# Patient Record
Sex: Male | Born: 1975 | Race: White | Hispanic: No | Marital: Single | Smoking: Current some day smoker
Health system: Southern US, Community
[De-identification: ages and names within clinical notes are randomized; demographics above are authoritative.]

## PROBLEM LIST (undated history)

## (undated) HISTORY — PX: ABSCESS DRAINAGE: SHX1119

---

## 2007-01-09 ENCOUNTER — Inpatient Hospital Stay: Payer: Self-pay | Admitting: Unknown Physician Specialty

## 2007-02-13 ENCOUNTER — Emergency Department: Payer: Self-pay | Admitting: Internal Medicine

## 2007-09-24 ENCOUNTER — Emergency Department: Payer: Self-pay | Admitting: Emergency Medicine

## 2009-12-04 ENCOUNTER — Emergency Department: Payer: Self-pay | Admitting: Emergency Medicine

## 2013-09-21 ENCOUNTER — Emergency Department: Payer: Self-pay | Admitting: Emergency Medicine

## 2013-09-21 LAB — URINALYSIS, COMPLETE
Bacteria: NONE SEEN
Bilirubin,UR: NEGATIVE
Blood: NEGATIVE
Glucose,UR: NEGATIVE mg/dL (ref 0–75)
Leukocyte Esterase: NEGATIVE
Nitrite: NEGATIVE
Ph: 6 (ref 4.5–8.0)
Protein: NEGATIVE
RBC,UR: <1 /HPF (ref 0–5)
Specific Gravity: 1.02 (ref 1.003–1.030)
Squamous Epithelial: <1
WBC UR: <1 /HPF (ref 0–5)

## 2013-09-21 LAB — COMPREHENSIVE METABOLIC PANEL
Anion Gap: 5 — ABNORMAL LOW (ref 7–16)
Bilirubin,Total: 0.8 mg/dL (ref 0.2–1.0)
Calcium, Total: 9.8 mg/dL (ref 8.5–10.1)
Chloride: 105 mmol/L (ref 98–107)
Osmolality: 277 (ref 275–301)
Potassium: 3.9 mmol/L (ref 3.5–5.1)
SGOT(AST): 28 U/L (ref 15–37)
SGPT (ALT): 35 U/L (ref 12–78)
Sodium: 138 mmol/L (ref 136–145)

## 2013-09-21 LAB — DRUG SCREEN, URINE
Amphetamines, Ur Screen: NEGATIVE (ref ?–1000)
Barbiturates, Ur Screen: NEGATIVE (ref ?–200)
Cannabinoid 50 Ng, Ur ~~LOC~~: NEGATIVE (ref ?–50)
Cocaine Metabolite,Ur ~~LOC~~: POSITIVE (ref ?–300)
MDMA (Ecstasy)Ur Screen: NEGATIVE (ref ?–500)
Methadone, Ur Screen: NEGATIVE (ref ?–300)
Phencyclidine (PCP) Ur S: NEGATIVE (ref ?–25)
Tricyclic, Ur Screen: NEGATIVE (ref ?–1000)

## 2013-09-21 LAB — CBC
HCT: 49.9 % (ref 40.0–52.0)
HGB: 17.6 g/dL (ref 13.0–18.0)
Platelet: 183 10*3/uL (ref 150–440)
RDW: 13.5 % (ref 11.5–14.5)
WBC: 8.7 10*3/uL (ref 3.8–10.6)

## 2013-09-21 LAB — ETHANOL: Ethanol: <3 mg/dL

## 2013-09-21 LAB — TSH: Thyroid Stimulating Horm: 1.77 u[IU]/mL

## 2014-12-04 ENCOUNTER — Emergency Department: Payer: Self-pay | Admitting: Student

## 2014-12-04 LAB — BASIC METABOLIC PANEL
Anion Gap: 9 (ref 7–16)
BUN: 10 mg/dL (ref 7–18)
CALCIUM: 9.1 mg/dL (ref 8.5–10.1)
CO2: 23 mmol/L (ref 21–32)
CREATININE: 1.16 mg/dL (ref 0.60–1.30)
Chloride: 106 mmol/L (ref 98–107)
EGFR (African American): >60
EGFR (Non-African Amer.): >60
Glucose: 113 mg/dL — ABNORMAL HIGH (ref 65–99)
OSMOLALITY: 276 (ref 275–301)
POTASSIUM: 4.3 mmol/L (ref 3.5–5.1)
SODIUM: 138 mmol/L (ref 136–145)

## 2014-12-04 LAB — CBC WITH DIFFERENTIAL/PLATELET
BASOS ABS: 0 10*3/uL (ref 0.0–0.1)
Basophil %: 0.2 %
Eosinophil #: 0.2 10*3/uL (ref 0.0–0.7)
Eosinophil %: 1.2 %
HCT: 49.3 % (ref 40.0–52.0)
HGB: 16.9 g/dL (ref 13.0–18.0)
Lymphocyte #: 1.8 10*3/uL (ref 1.0–3.6)
Lymphocyte %: 14.8 %
MCH: 34 pg (ref 26.0–34.0)
MCHC: 34.3 g/dL (ref 32.0–36.0)
MCV: 99 fL (ref 80–100)
MONO ABS: 0.7 x10 3/mm (ref 0.2–1.0)
MONOS PCT: 5.6 %
NEUTROS ABS: 9.5 10*3/uL — AB (ref 1.4–6.5)
Neutrophil %: 78.2 %
Platelet: 173 10*3/uL (ref 150–440)
RBC: 4.96 10*6/uL (ref 4.40–5.90)
RDW: 13 % (ref 11.5–14.5)
WBC: 12.2 10*3/uL — ABNORMAL HIGH (ref 3.8–10.6)

## 2015-04-21 NOTE — H&P (Signed)
   Subjective/Chief Complaint Buttock abscess   History of Present Illness 39 yo with no significant past medical history who presents with 2 days of perianal pain.  Began acutely.  No improvement.  Doing well otherwise.  No fevers/chills.  Excruciating.   Code Status Full Code   Past Med/Surgical Hx:  Denies medical history:   Denies surgical history.:   ALLERGIES:  No Known Allergies:   Family and Social History:  Family History Non-Contributory   Social History positive  tobacco, negative ETOH   Place of Living Home   Review of Systems:  Subjective/Chief Complaint Perianal pain   Fever/Chills Yes   Cough No   Sputum No   Abdominal Pain No   Diarrhea No   Constipation No   Nausea/Vomiting No   SOB/DOE No   Chest Pain No   Dysuria No   Tolerating Diet No  Nauseated   Physical Exam:  GEN well developed, well nourished, no acute distress   HEENT pink conjunctivae, PERRL, hearing intact to voice   RESP normal resp effort  clear BS  no use of accessory muscles   CARD regular rate  no murmur  no thrills   ABD denies tenderness  normal BS  + right perianal pain/abscess   LYMPH negative neck, negative axillae   EXTR negative cyanosis/clubbing, negative edema   SKIN normal to palpation, No rashes, No ulcers   NEURO cranial nerves intact, negative rigidity, negative tremor, follows commands, spastic, flaccid, strength:, motor/sensory function intact   PSYCH A+O to time, place, person, good insight    Assessment/Admission Diagnosis 39 yo M with 2 days of perianal pain, obvious abscess on physical exam.   Plan To OR for I and D of perianal abscess.   Electronic Signatures: Jarvis NewcomerLundquist, Locke Barrell A (MD)  (Signed 07-Dec-15 20:18)  Authored: CHIEF COMPLAINT and HISTORY, PAST MEDICAL/SURGIAL HISTORY, ALLERGIES, FAMILY AND SOCIAL HISTORY, REVIEW OF SYSTEMS, PHYSICAL EXAM, ASSESSMENT AND PLAN   Last Updated: 07-Dec-15 20:18 by Jarvis NewcomerLundquist, Jaylenn Baiza A  (MD)

## 2015-04-21 NOTE — Op Note (Signed)
PATIENT NAME:  [name], Shaun Beasley MR#:  [id number] DATE OF BIRTH:  [birth year]  DATE OF PROCEDUCampbell StallRE:  12/04/2014  SURGEON: Cristal Deerhristopher A. Quintus Premo, MD  PREOPERATIVE DIAGNOSIS: Left perianal abscess.   POSTOPERATIVE DIAGNOSIS: Left perianal abscess.   PROCEDURES PERFORMED: Incision and drainage of left perianal abscess, rectal examination under anesthesia.   ANESTHESIA: General.   ESTIMATED BLOOD LOSS: 10 mL.   COMPLICATIONS: None.   SPECIMENS: None.   INDICATION FOR SURGERY: Mr. Shaun Beasley is a pleasant 39 year old male who presents with increasing perianal pain. He had an area of induration and erythema on examination. He, thus, was brought to the operating room for I and D of probable perianal abscess and rectal examination under anesthesia.   DETAILS OF PROCEDURE: Informed consent was obtained. Mr. Shaun Beasley was brought to the operating room suite. He was induced. Endotracheal tube was placed, general anesthesia was administered. He was put up in stirrups. His anus was prepped and draped in standard surgical fashion. A timeout was then performed correctly identifying the patient name, operative site, and procedure to be performed. Next, there was no obvious fistulous connection. No purulence when the abscess was squeezed. I then used an 11 blade knife to incise the abscess. There was a large amount of purulence. I used a hemostat to open up any loculations. I then made an ellipse of skin to excise to prevent the wound from closing. The wound was irrigated and then packed with half inch iodoform gauze. A sterile dressing was then placed over the wound. The patient was then awoken, extubated, and brought to the postanesthesia care unit. There were no immediate complications. Needle, sponge, and instrument counts correct at the end of the procedure.    ____________________________ Si Raiderhristopher A. Ariana Juul, MD cal:bm D: 12/05/2014 19:18:09 ET T: 12/05/2014 22:58:15 ET JOB#: [id number]  cc: Cristal Deerhristopher  A. Reegan Bouffard, MD, <Dictator> Jarvis NewcomerHRISTOPHER A Huie Ghuman MD ELECTRONICALLY SIGNED 12/26/2014 16:21

## 2015-04-30 IMAGING — CT CT PELVIS W/ CM
2 of 3 series · 16 of 46 positions shown, 18 images · IV contrast (agent unspecified)
Comparison: None.

CLINICAL DATA: Leukocytosis, extreme pain from perianal abscess

EXAM:
CT PELVIS WITH CONTRAST
TECHNIQUE: Multidetector CT imaging of the pelvis was performed using the
standard protocol following the bolus administration of intravenous
contrast.
CONTRAST:  100 mL Qsovue-MDD

[Series 2: routine pel with · axial · 0.72mm/px · z∈[-490,-230]mm · 13 of 60 slices shown, 15 images]
[im 4/60  soft-tissue]
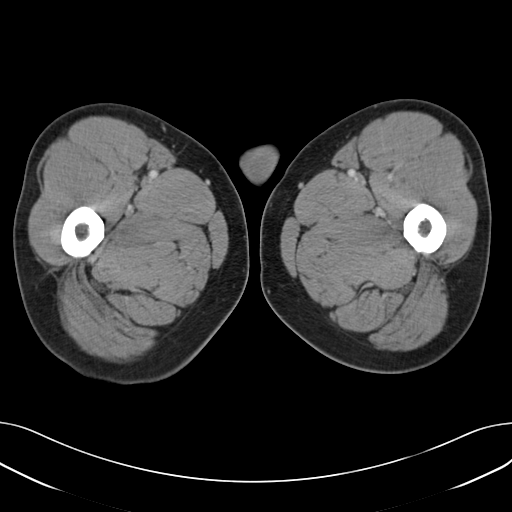
[im 4/60  bone]
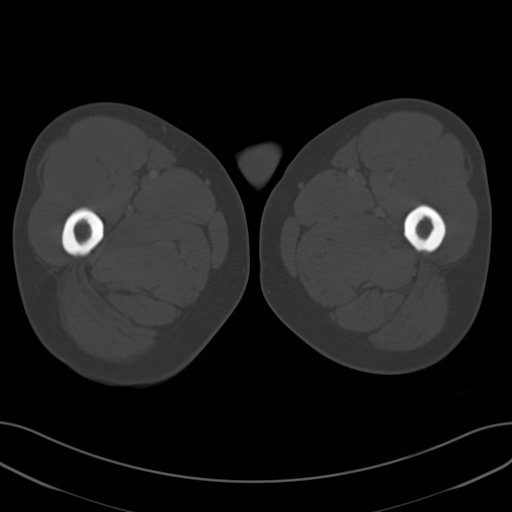
[im 8/60  soft-tissue]
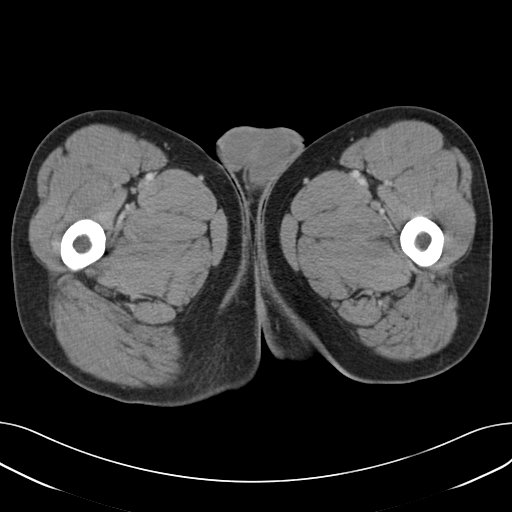
[im 12/60  soft-tissue]
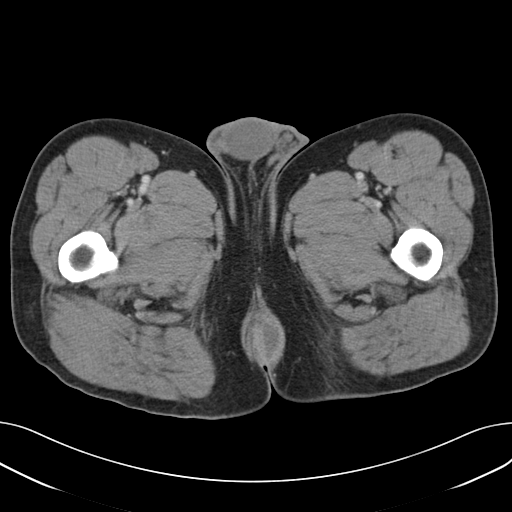
[im 18/60  soft-tissue]
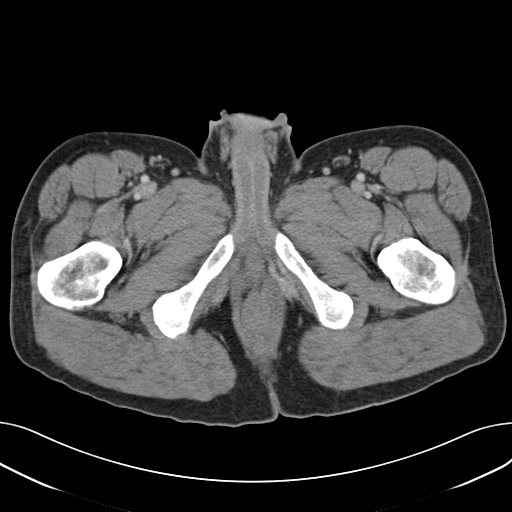
[im 21/60  soft-tissue]
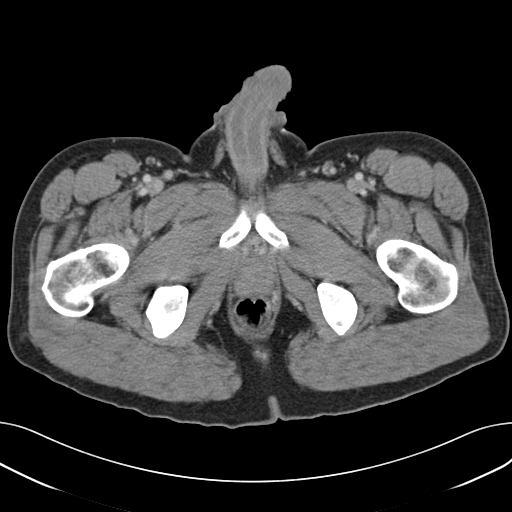
[im 25/60  soft-tissue]
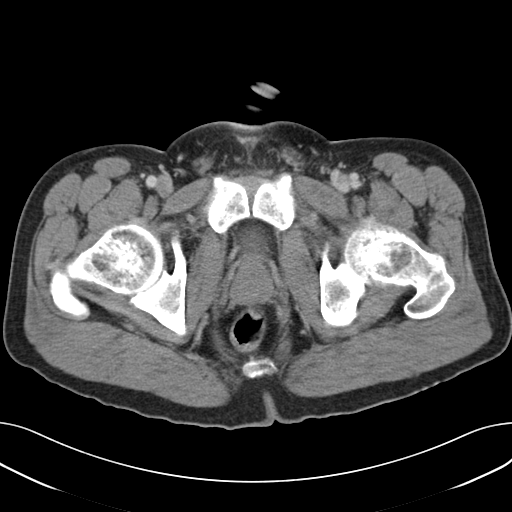
[im 31/60  soft-tissue]
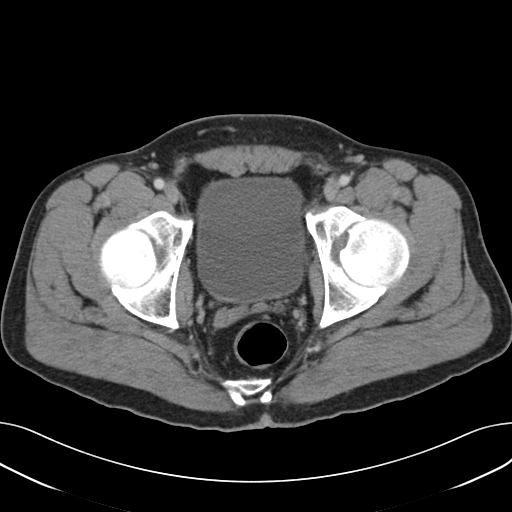
[im 35/60  soft-tissue]
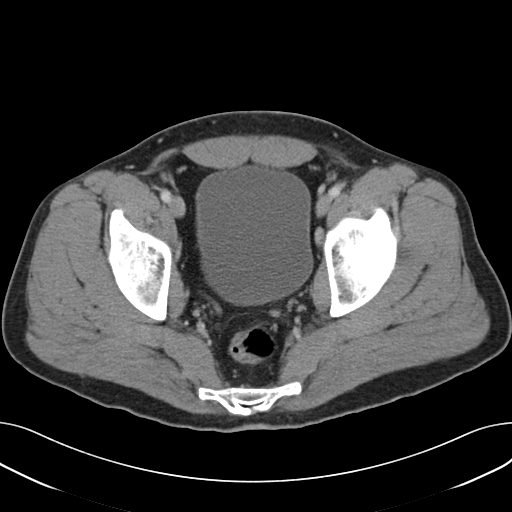
[im 39/60  soft-tissue]
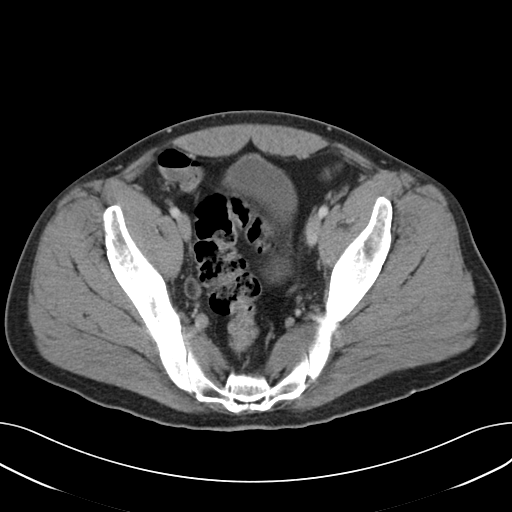
[im 39/60  bone]
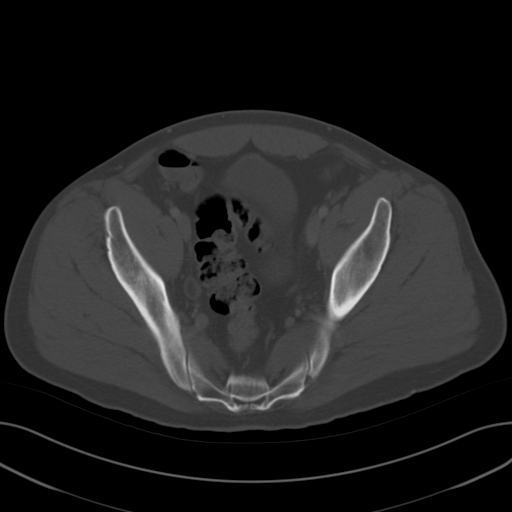
[im 42/60  soft-tissue]
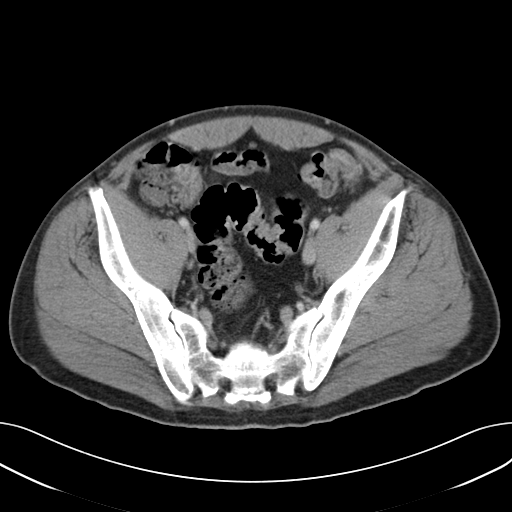
[im 48/60  soft-tissue]
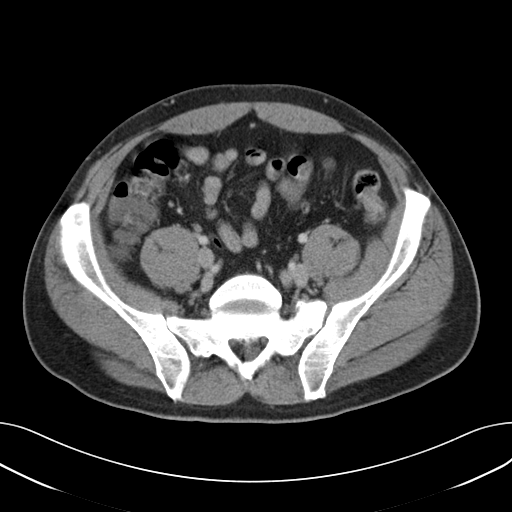
[im 52/60  soft-tissue]
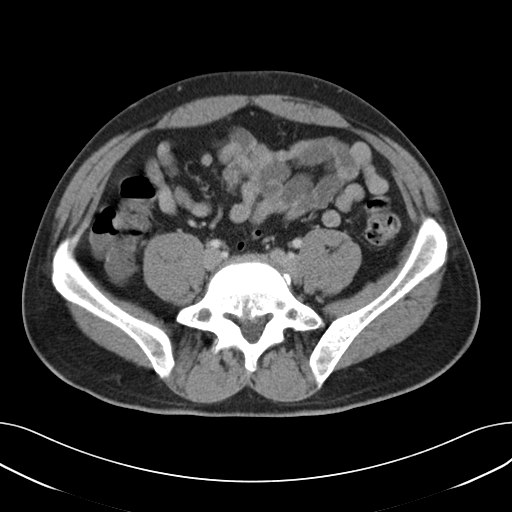
[im 56/60  soft-tissue]
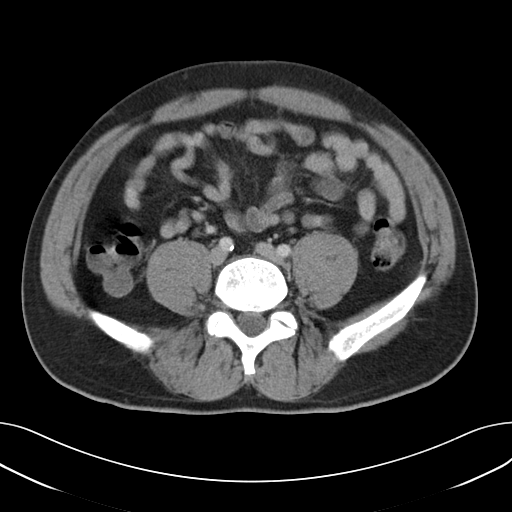

[Series 4: cor routine pel with · coronal · 0.55mm/px · 3 of 119 slices shown]
[im 40/119  soft-tissue]
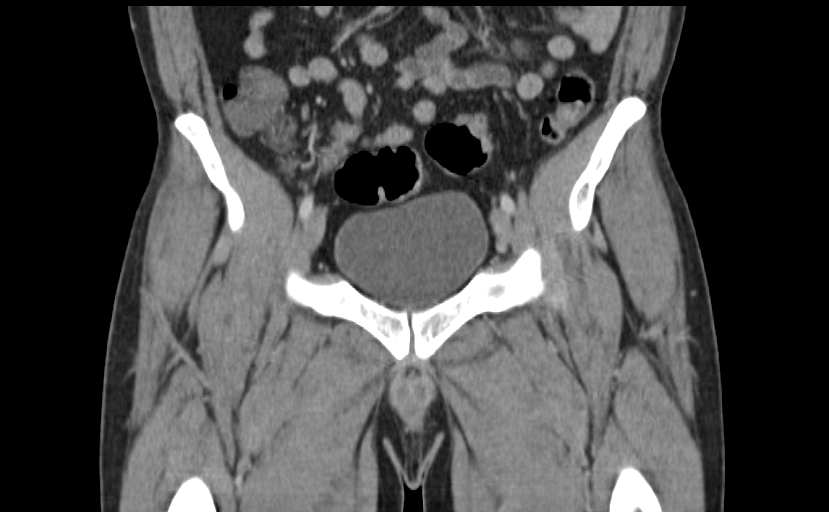
[im 53/119  soft-tissue]
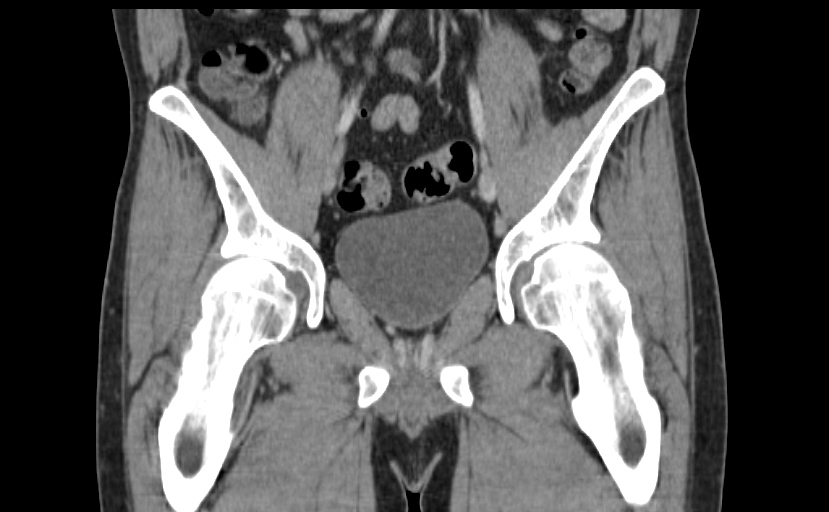
[im 66/119  soft-tissue]
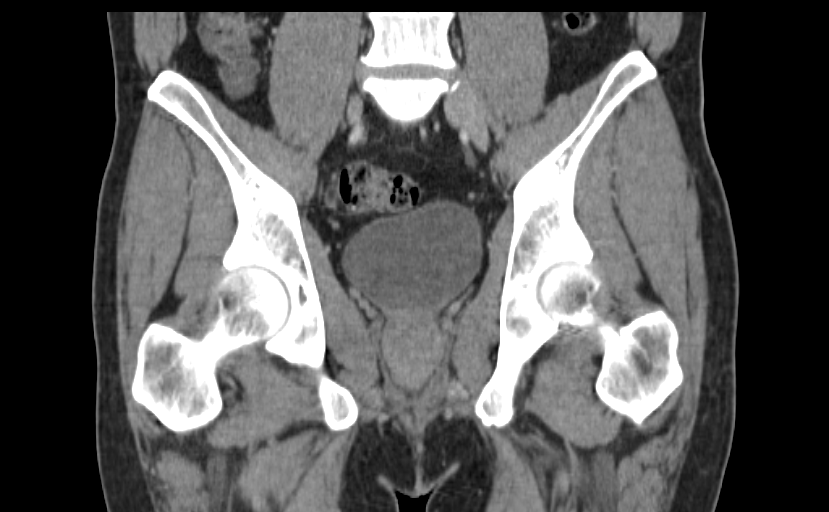

[16 of 46 positions shown; findings below may reference images not displayed]

FINDINGS: The bladder is well distended. Mild diverticular change is seen. The
appendix is within normal limits. No pelvic mass lesion is noted. No
significant lymphadenopathy is noted.

Just to the left of the anus there is a 3.2 x 1.4 cm fluid
attenuation lesion with peripheral enhancement consistent with the
given clinical history of PE renal abscess. No significant
surrounding inflammatory changes noted. No other focal abnormality
is seen. The visualized bony structures are within normal limits.
IMPRESSION: Left perianal abscess as described above. No other focal abnormality
is noted.

## 2016-06-09 ENCOUNTER — Emergency Department: Payer: Self-pay

## 2016-06-09 ENCOUNTER — Emergency Department
Admission: EM | Admit: 2016-06-09 | Discharge: 2016-06-09 | Disposition: A | Discharge status: Home / Self Care | Payer: Self-pay | Attending: Emergency Medicine | Admitting: Emergency Medicine

## 2016-06-09 ENCOUNTER — Encounter: Payer: Self-pay | Admitting: Emergency Medicine

## 2016-06-09 DIAGNOSIS — Y999 Unspecified external cause status: Secondary | ICD-10-CM | POA: Insufficient documentation

## 2016-06-09 DIAGNOSIS — Y9389 Activity, other specified: Secondary | ICD-10-CM | POA: Insufficient documentation

## 2016-06-09 DIAGNOSIS — Y929 Unspecified place or not applicable: Secondary | ICD-10-CM | POA: Insufficient documentation

## 2016-06-09 DIAGNOSIS — W1840XA Slipping, tripping and stumbling without falling, unspecified, initial encounter: Secondary | ICD-10-CM | POA: Insufficient documentation

## 2016-06-09 DIAGNOSIS — F17210 Nicotine dependence, cigarettes, uncomplicated: Secondary | ICD-10-CM | POA: Insufficient documentation

## 2016-06-09 DIAGNOSIS — S2232XA Fracture of one rib, left side, initial encounter for closed fracture: Secondary | ICD-10-CM | POA: Insufficient documentation

## 2016-06-09 MED ORDER — OXYCODONE-ACETAMINOPHEN 5-325 MG PO TABS
1.0000 | ORAL_TABLET | Freq: Once | ORAL | Status: AC
  Administered 2016-06-09: 1 via ORAL
  Filled 2016-06-09: qty 1

## 2016-06-09 MED ORDER — OXYCODONE-ACETAMINOPHEN 7.5-325 MG PO TABS: 1.0000 | ORAL_TABLET | ORAL | Status: AC | PRN

## 2016-06-09 MED ORDER — IBUPROFEN 600 MG PO TABS: 600.0000 mg | ORAL_TABLET | Freq: Four times a day (QID) | ORAL | Status: AC | PRN

## 2016-06-09 MED ORDER — IBUPROFEN 600 MG PO TABS
600.0000 mg | ORAL_TABLET | Freq: Once | ORAL | Status: AC
  Administered 2016-06-09: 600 mg via ORAL
  Filled 2016-06-09: qty 1

## 2016-06-09 NOTE — Discharge Instructions (Signed)
Rib Fracture °A rib fracture is a break or crack in one of the bones of the ribs. The ribs are like a cage that goes around your upper chest. A broken or cracked rib is often painful, but most do not cause other problems. Most rib fractures heal on their own in 1-3 months. °HOME CARE °· Avoid activities that cause pain to the injured area. Protect your injured area. °· Slowly increase activity as told by your doctor. °· Take medicine as told by your doctor. °· Put ice on the injured area for the first 1-2 days after you have been treated or as told by your doctor. °¨ Put ice in a plastic bag. °¨ Place a towel between your skin and the bag. °¨ Leave the ice on for 15-20 minutes at a time, every 2 hours while you are awake. °· Do deep breathing as told by your doctor. You may be told to: °¨ Take deep breaths many times a day. °¨ Cough many times a day while hugging a pillow. °¨ Use a device (incentive spirometer) to perform deep breathing many times a day. °· Drink enough fluids to keep your pee (urine) clear or pale yellow.   °· Do not wear a rib belt or binder. These do not allow you to breathe deeply. °GET HELP RIGHT AWAY IF:  °· You have a fever. °· You have trouble breathing.   °· You cannot stop coughing. °· You cough up thick or bloody spit (mucus).   °· You feel sick to your stomach (nauseous), throw up (vomit), or have belly (abdominal) pain.   °· Your pain gets worse and medicine does not help.   °MAKE SURE YOU:  °· Understand these instructions. °· Will watch your condition. °· Will get help right away if you are not doing well or get worse. °  °This information is not intended to replace advice given to you by your health care provider. Make sure you discuss any questions you have with your health care provider. °  °Document Released: 09/23/2008 Document Revised: 04/11/2013 Document Reviewed: 02/16/2013 °Elsevier Interactive Patient Education ©2016 [employer] ° °

## 2016-06-09 NOTE — ED Provider Notes (Signed)
[facility] Emergency Department Provider Note    ____________________________________________  Time seen: Approximately 1:23 PM  I have reviewed the triage vital signs and the nursing notes.   HISTORY  Chief Complaint Rib Injury    HPI Shaun Beasley is a 40 y.o. male patient complained of left anterior rib pain secondary to a slip and slide incident. Patient's the incident occurred with ago and is no improvement. Patient states pain increase with movement and deep breathing. Patient is taking over-the-counter anti-inflammatories without any relief.Patient rates his pain as a 5/10. Patient described a pain as "intermittently sharp". No other palliative measures taken for this complaint.   History reviewed. No pertinent past medical history.  There are no active problems to display for this patient.   Past Surgical History  Procedure Laterality Date  . Abscess drainage      Current Outpatient Rx  Name  Route  Sig  Dispense  Refill  . ibuprofen (ADVIL,MOTRIN) 600 MG tablet   Oral   Take 1 tablet (600 mg total) by mouth every 6 (six) hours as needed.   30 tablet   0   . oxyCODONE-acetaminophen (PERCOCET) 7.5-325 MG tablet   Oral   Take 1 tablet by mouth every 4 (four) hours as needed for severe pain.   20 tablet   0     Allergies Review of patient's allergies indicates not on file.  History reviewed. No pertinent family history.  Social History Social History  Substance Use Topics  . Smoking status: Current Some Day Smoker -- 0.50 packs/day    Types: Cigarettes  . Smokeless tobacco: None  . Alcohol Use: 15.0 oz/week    25 Cans of beer per week    Review of Systems Constitutional: No fever/chills Eyes: No visual changes. ENT: No sore throat. Cardiovascular: Denies chest pain. Respiratory: Denies shortness of breath. Gastrointestinal: No abdominal pain.  No nausea, no vomiting.  No diarrhea.  No  constipation. Genitourinary: Negative for dysuria. Musculoskeletal:Left rib pain Skin: Negative for rash. Neurological: Negative for headaches, focal weakness or numbness.   ____________________________________________   PHYSICAL EXAM:  VITAL SIGNS: ED Triage Vitals  Enc Vitals Group     BP 06/09/16 1309 125/67 mmHg     Pulse Rate 06/09/16 1309 69     Resp 06/09/16 1309 18     Temp 06/09/16 1309 98.4 F (36.9 C)     Temp Source 06/09/16 1309 Oral     SpO2 06/09/16 1309 97 %     Weight 06/09/16 1309 200 lb (90.719 kg)     Height 06/09/16 1309 6\' 1"  (1.854 m)     Head Cir --      Peak Flow --      Pain Score 06/09/16 1309 5     Pain Loc --      Pain Edu? --      Excl. in GC? --     Constitutional: Alert and oriented. Well appearing and in no acute distress. Eyes: Conjunctivae are normal. PERRL. EOMI. Head: Atraumatic. Nose: No congestion/rhinnorhea. Mouth/Throat: Mucous membranes are moist.  Oropharynx non-erythematous. Neck: No stridor.  No cervical spine tenderness to palpation. Hematological/Lymphatic/Immunilogical: No cervical lymphadenopathy. Cardiovascular: Normal rate, regular rhythm. Grossly normal heart sounds.  Good peripheral circulation. Respiratory: Normal respiratory effort.  No retractions. Lungs CTAB. Gastrointestinal: Soft and nontender. No distention. No abdominal bruits. No CVA tenderness. Musculoskeletal:No obvious chest wall deformities. There is no abrasion or ecchymosis. Patient tender palpation anterior chest wall. Patient  is splinting with deep inspiration.  Neurologic:  Normal speech and language. No gross focal neurologic deficits are appreciated. No gait instability. Skin:  Skin is warm, dry and intact. No rash noted. Psychiatric: Mood and affect are normal. Speech and behavior are normal.  ____________________________________________   LABS (all labs ordered are listed, but only abnormal results are displayed)  Labs Reviewed - No data to  display ____________________________________________  EKG   ____________________________________________  RADIOLOGY  Patient has nondisplaced seventh left rib fracture. ____________________________________________   PROCEDURES  Procedure(s) performed: None  Critical Care performed: No  ____________________________________________   INITIAL IMPRESSION / ASSESSMENT AND PLAN / ED COURSE  Pertinent labs & imaging results that were available during my care of the patient were reviewed by me and considered in my medical decision making (see chart for details).  Left lateral rib fracture. Cuts x-ray finding with patient. Patient given discharge care instructions. Patient given a work note. Patient given a prescription for Percocets and ibuprofen. Patient advised [facility]. ____________________________________________   FINAL CLINICAL IMPRESSION(S) / ED DIAGNOSES  Final diagnoses:  Left rib fracture, closed, initial encounter      NEW MEDICATIONS STARTED DURING THIS VISIT:  New Prescriptions   IBUPROFEN (ADVIL,MOTRIN) 600 MG TABLET    Take 1 tablet (600 mg total) by mouth every 6 (six) hours as needed.   OXYCODONE-ACETAMINOPHEN (PERCOCET) 7.5-325 MG TABLET    Take 1 tablet by mouth every 4 (four) hours as needed for severe pain.     Note:  This document was prepared using Dragon voice recognition software and may include unintentional dictation errors.    Joni Reining, PA-C 06/09/16 1400  Sharman Cheek, MD 06/09/16 [phone]

## 2016-06-09 NOTE — ED Notes (Signed)
Pt from home with pain in ribs caused by injury during slip-n-slide play. Pt states it hasn't gotten better and that he has some sob when he moves in certain ways and when he takes a deep breath. Pt alert & oriented with NAD noted.

## 2016-11-03 IMAGING — CR DG RIBS W/ CHEST 3+V*L*
1 series · 5 of 5 positions shown · non-contrast
Comparison: None.

CLINICAL DATA: Left anterior rib pain since the patient fell on a
slip and slide 1 week ago. Initial encounter.

EXAM:
LEFT RIBS AND CHEST - 3+ VIEW

[Series 1: dg ribs unilateral w/chest left · 0.14mm/px · 5 of 5 slices shown]
[im 1/5]
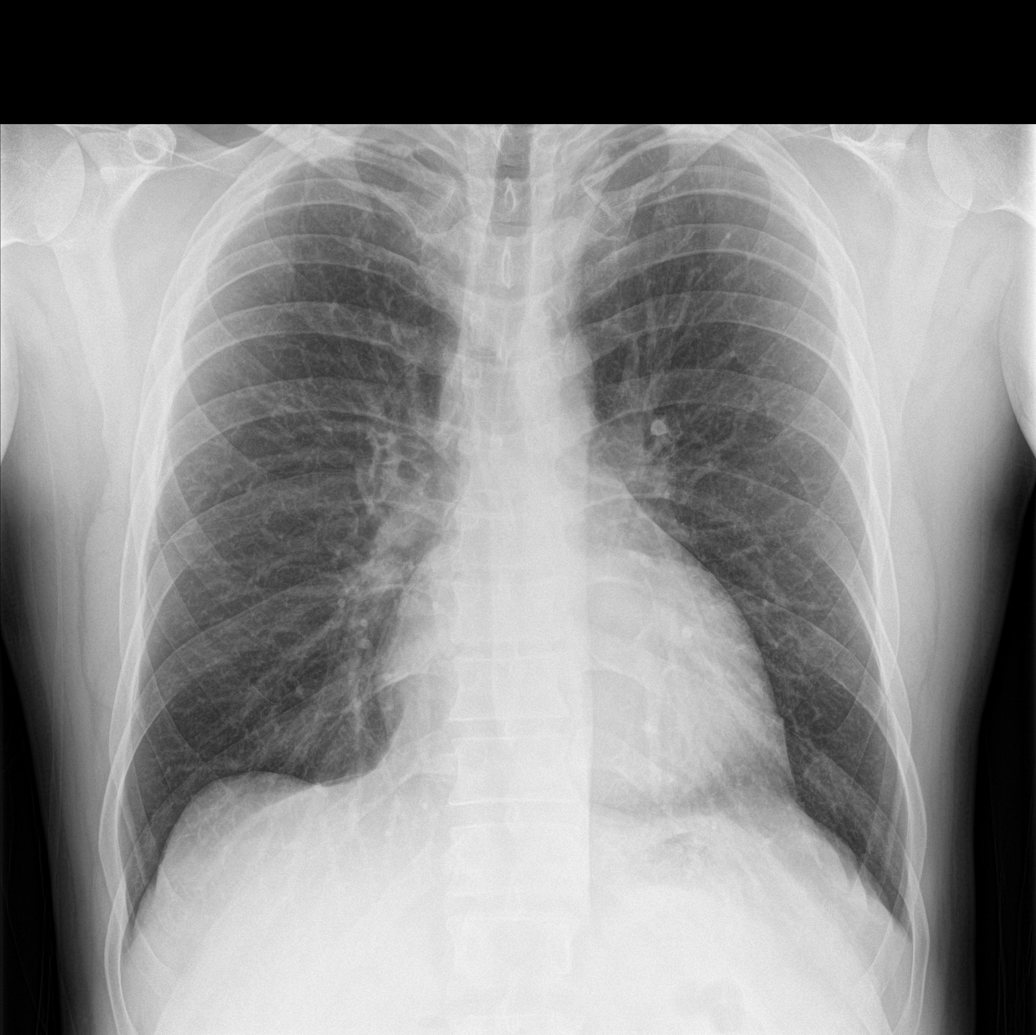
[im 2/5]
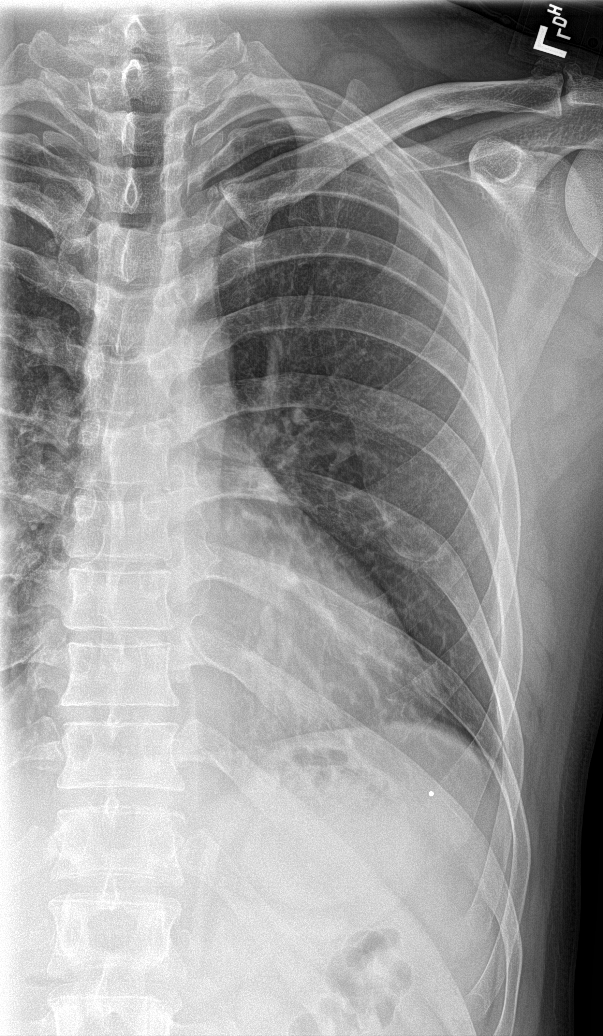
[im 3/5]
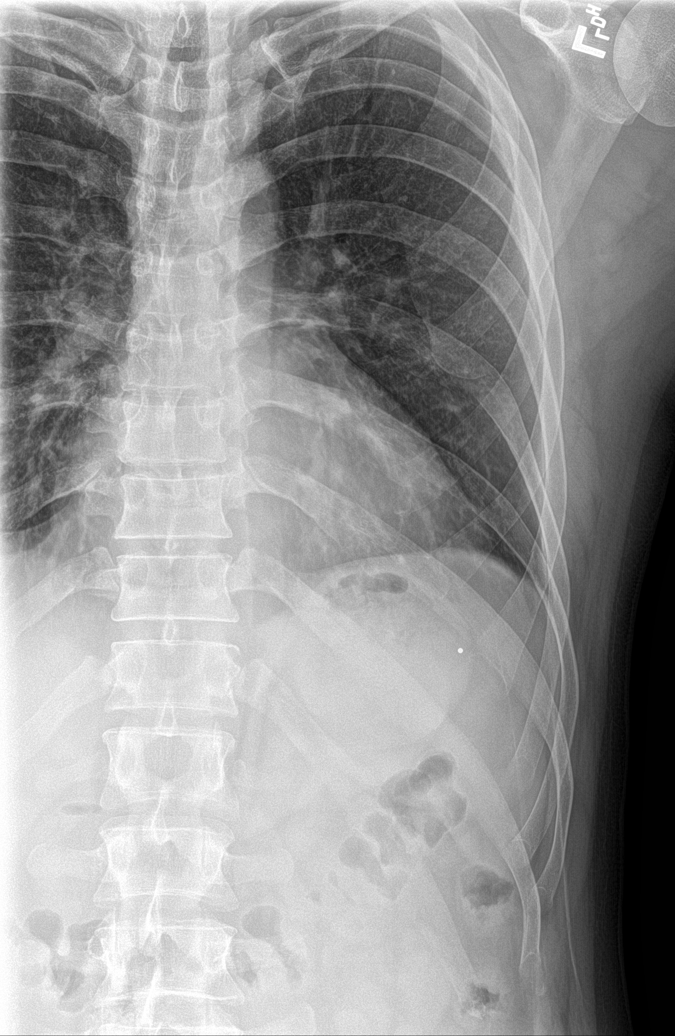
[im 4/5]
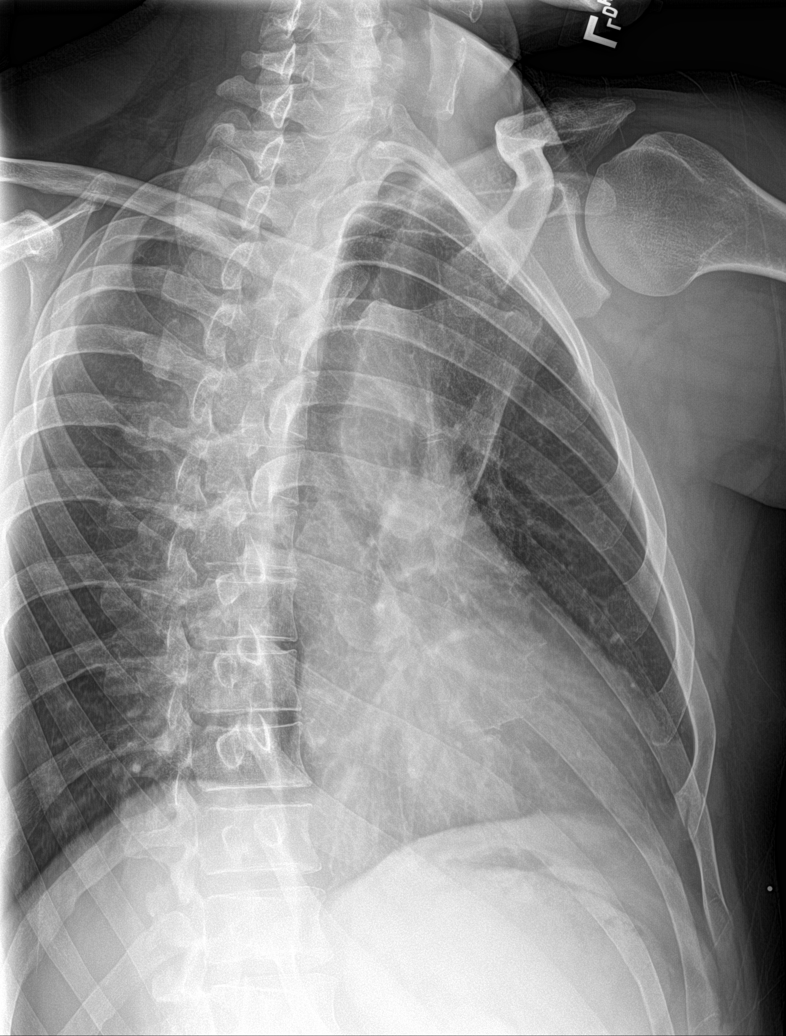
[im 5/5]
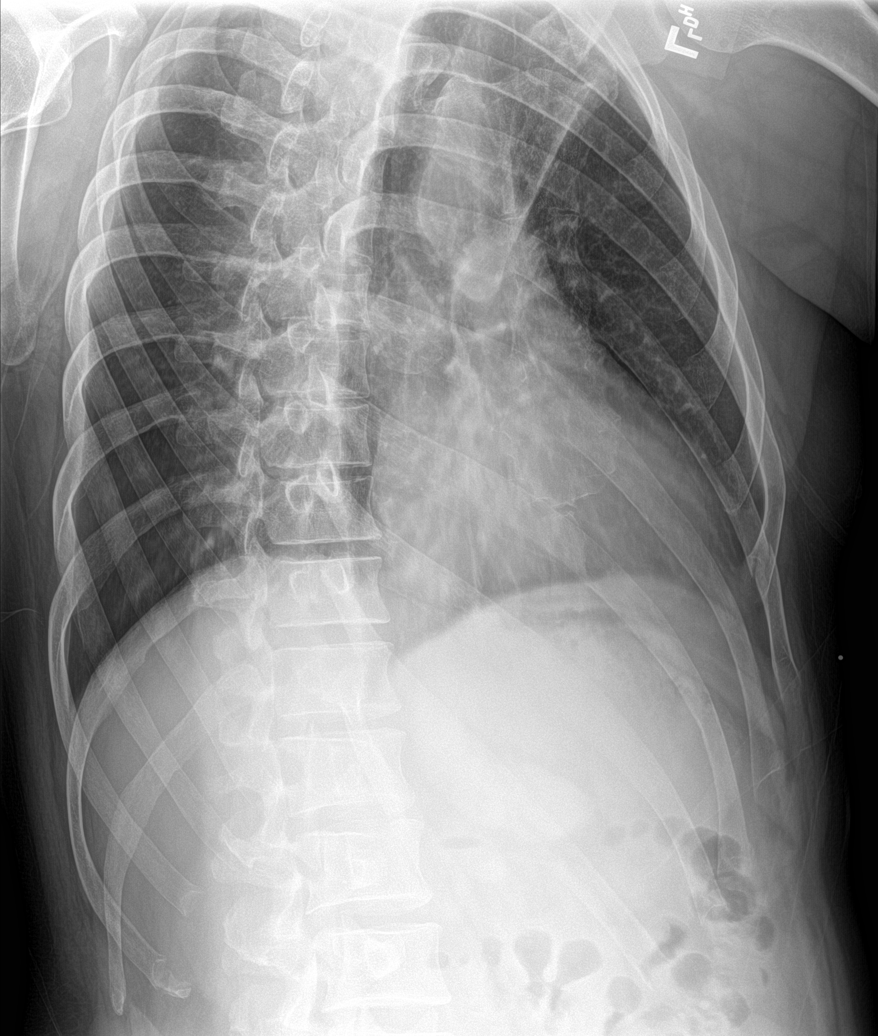

[5 of 5 positions shown; findings below may reference images not displayed]

FINDINGS: The lungs are clear. Heart size is normal. No pneumothorax or
pleural effusion. Minimally displaced fracture of the left eighth
rib is identified.
IMPRESSION: Minimally displaced left eighth rib fracture. The examination is
otherwise negative.

## 2017-05-02 ENCOUNTER — Encounter: Payer: Self-pay | Admitting: Emergency Medicine

## 2017-05-02 ENCOUNTER — Emergency Department
Admission: EM | Admit: 2017-05-02 | Discharge: 2017-05-02 | Disposition: A | Discharge status: Home / Self Care | Payer: Self-pay | Attending: Emergency Medicine | Admitting: Emergency Medicine

## 2017-05-02 DIAGNOSIS — S91312A Laceration without foreign body, left foot, initial encounter: Secondary | ICD-10-CM

## 2017-05-02 DIAGNOSIS — Y9302 Activity, running: Secondary | ICD-10-CM | POA: Insufficient documentation

## 2017-05-02 DIAGNOSIS — Y999 Unspecified external cause status: Secondary | ICD-10-CM | POA: Insufficient documentation

## 2017-05-02 DIAGNOSIS — Y929 Unspecified place or not applicable: Secondary | ICD-10-CM | POA: Insufficient documentation

## 2017-05-02 DIAGNOSIS — S91112A Laceration without foreign body of left great toe without damage to nail, initial encounter: Secondary | ICD-10-CM | POA: Insufficient documentation

## 2017-05-02 DIAGNOSIS — W269XXA Contact with unspecified sharp object(s), initial encounter: Secondary | ICD-10-CM | POA: Insufficient documentation

## 2017-05-02 DIAGNOSIS — F17210 Nicotine dependence, cigarettes, uncomplicated: Secondary | ICD-10-CM | POA: Insufficient documentation

## 2017-05-02 MED ORDER — LIDOCAINE HCL (PF) 1 % IJ SOLN
INTRAMUSCULAR | Status: AC
  Filled 2017-05-02: qty 5

## 2017-05-02 MED ORDER — PENTAFLUOROPROP-TETRAFLUOROETH EX AERO
INHALATION_SPRAY | CUTANEOUS | Status: AC
  Filled 2017-05-02: qty 30

## 2017-05-02 NOTE — ED Triage Notes (Addendum)
Pt to triage via WC in [employer] custody, report was running bare foot and cut left great toe, need medical clearance.  Laceration to sole of left foot noted just below toe, bleeding controlled at this time.

## 2017-05-02 NOTE — ED Notes (Signed)
Lac kit, 1% lido without epi at bedside

## 2017-05-02 NOTE — ED Provider Notes (Signed)
[facility] Emergency Department Provider Note   First MD Initiated Contact with Patient 05/02/17 [id number]     (approximate)  I have reviewed the triage vital signs and the nursing notes.   HISTORY  Chief Complaint Toe Injury    HPI Shaun Beasley is a 41 y.o. male presents emergency department [employer] custody with laceration to the base of his left great toe. Patient states that he sustained a cut while running barefoot.   Past medical history Noncontributory  There are no active problems to display for this patient.   Past Surgical History:  Procedure Laterality Date  . ABSCESS DRAINAGE      Prior to Admission medications   Medication Sig Start Date End Date Taking? Authorizing Provider  ibuprofen (ADVIL,MOTRIN) 600 MG tablet Take 1 tablet (600 mg total) by mouth every 6 (six) hours as needed. 06/09/16   Joni ReiningSmith, Ronald K, PA-C  oxyCODONE-acetaminophen (PERCOCET) 7.5-325 MG tablet Take 1 tablet by mouth every 4 (four) hours as needed for severe pain. 06/09/16 06/09/17  Joni ReiningSmith, Ronald K, PA-C    Allergies No known drug allergies   Family history Noncontributory  Social History Social History  Substance Use Topics  . Smoking status: Current Some Day Smoker    Packs/day: 0.50    Types: Cigarettes  . Smokeless tobacco: Never Used  . Alcohol use 15.0 oz/week    25 Cans of beer per week    Review of Systems Constitutional: No fever/chills Eyes: No visual changes. ENT: No sore throat. Cardiovascular: Denies chest pain. Respiratory: Denies shortness of breath. Gastrointestinal: No abdominal pain.  No nausea, no vomiting.  No diarrhea.  No constipation. Genitourinary: Negative for dysuria. Musculoskeletal: Negative for back pain. Integumentary: Negative for rash.Laceration plantar aspect of the left foot Neurological: Negative for headaches, focal weakness or  numbness.   ____________________________________________   PHYSICAL EXAM:  VITAL SIGNS: ED Triage Vitals  Enc Vitals Group     BP 05/02/17 0400 126/78     Pulse Rate 05/02/17 0400 78     Resp 05/02/17 0400 16     Temp 05/02/17 0400 98.4 F (36.9 C)     Temp Source 05/02/17 0400 Oral     SpO2 05/02/17 0400 99 %     Weight 05/02/17 0400 200 lb (90.7 kg)     Height 05/02/17 0400 6\' 1"  (1.854 m)     Head Circumference --      Peak Flow --      Pain Score 05/02/17 0359 10     Pain Loc --      Pain Edu? --      Excl. in GC? --     Constitutional: Alert and oriented. Well appearing and in no acute distress. Eyes: Conjunctivae are normal. PERRL. EOMI. Head: Atraumatic. Mouth/Throat: Mucous membranes are moist. Neck: No stridor.  Cardiovascular: Normal rate, regular rhythm. Good peripheral circulation. Grossly normal heart sounds. Respiratory: Normal respiratory effort.  No retractions. Lungs CTAB. Gastrointestinal: Soft and nontender. No distention.  Musculoskeletal: No lower extremity tenderness nor edema. No gross deformities of extremities. Neurologic:  Normal speech and language. No gross focal neurologic deficits are appreciated.  Skin:  3 cm linear laceration plantar aspect of the base left great toe. Bleeding controlled.     Marland Kitchen..Laceration Repair Date/Time: 05/02/2017 5:45 AM Performed by: Darci CurrentBROWN, Tonica N Authorized by: Darci CurrentBROWN, Compton N   Consent:    Consent obtained:  Verbal   Consent given by:  Patient  Risks discussed:  Pain, infection and poor cosmetic result   Alternatives discussed:  No treatment Anesthesia (see MAR for exact dosages):    Anesthesia method:  Local infiltration   Local anesthetic:  Lidocaine 1% w/o epi Laceration details:    Location:  Foot   Foot location:  Sole of L foot   Length (cm):  3   Depth (mm):  3 Repair type:    Repair type:  Simple Exploration:    Contaminated: yes   Treatment:    Area cleansed with:  Betadine and  saline   Amount of cleaning:  Extensive   Irrigation solution:  Sterile saline   Visualized foreign bodies/material removed: no   Skin repair:    Repair method:  Sutures   Suture size:  5-0   Suture material:  Nylon   Suture technique:  Simple interrupted   Number of sutures:  4 Approximation:    Approximation:  Close Post-procedure details:    Dressing:  Open (no dressing)   Patient tolerance of procedure:  Tolerated well, no immediate complications     ____________________________________________   INITIAL IMPRESSION / ASSESSMENT AND PLAN / ED COURSE  Pertinent labs & imaging results that were available during my care of the patient were reviewed by me and considered in my medical decision making (see chart for details).        ____________________________________________  FINAL CLINICAL IMPRESSION(S) / ED DIAGNOSES  Final diagnoses:  Foot laceration, left, initial encounter     MEDICATIONS GIVEN DURING THIS VISIT:  Medications  pentafluoroprop-tetrafluoroeth (GEBAUERS) aerosol (not administered)  lidocaine (PF) (XYLOCAINE) 1 % injection (not administered)     NEW OUTPATIENT MEDICATIONS STARTED DURING THIS VISIT:  New Prescriptions   No medications on file    Modified Medications   No medications on file    Discontinued Medications   No medications on file     Note:  This document was prepared using Dragon voice recognition software and may include unintentional dictation errors.    Darci Current, MD 05/02/17 [phone]

## 2018-05-08 ENCOUNTER — Encounter: Payer: Self-pay | Admitting: Emergency Medicine

## 2018-05-08 ENCOUNTER — Emergency Department
Admission: EM | Admit: 2018-05-08 | Discharge: 2018-05-08 | Disposition: A | Discharge status: Home / Self Care | Payer: Self-pay | Attending: Emergency Medicine | Admitting: Emergency Medicine

## 2018-05-08 DIAGNOSIS — Y939 Activity, unspecified: Secondary | ICD-10-CM | POA: Insufficient documentation

## 2018-05-08 DIAGNOSIS — Y929 Unspecified place or not applicable: Secondary | ICD-10-CM | POA: Insufficient documentation

## 2018-05-08 DIAGNOSIS — Z79899 Other long term (current) drug therapy: Secondary | ICD-10-CM | POA: Insufficient documentation

## 2018-05-08 DIAGNOSIS — F17210 Nicotine dependence, cigarettes, uncomplicated: Secondary | ICD-10-CM | POA: Insufficient documentation

## 2018-05-08 DIAGNOSIS — Y999 Unspecified external cause status: Secondary | ICD-10-CM | POA: Insufficient documentation

## 2018-05-08 DIAGNOSIS — S0501XA Injury of conjunctiva and corneal abrasion without foreign body, right eye, initial encounter: Secondary | ICD-10-CM | POA: Insufficient documentation

## 2018-05-08 DIAGNOSIS — H1131 Conjunctival hemorrhage, right eye: Secondary | ICD-10-CM | POA: Insufficient documentation

## 2018-05-08 DIAGNOSIS — X58XXXA Exposure to other specified factors, initial encounter: Secondary | ICD-10-CM | POA: Insufficient documentation

## 2018-05-08 MED ORDER — TETRACAINE HCL 0.5 % OP SOLN
2.0000 [drp] | Freq: Once | OPHTHALMIC | Status: AC
  Administered 2018-05-08: 2 [drp] via OPHTHALMIC
  Filled 2018-05-08: qty 4

## 2018-05-08 MED ORDER — POLYMYXIN B-TRIMETHOPRIM 10000-0.1 UNIT/ML-% OP SOLN: 2.0000 [drp] | Freq: Four times a day (QID) | OPHTHALMIC | 0 refills | Status: DC

## 2018-05-08 MED ORDER — FLUORESCEIN SODIUM 1 MG OP STRP
1.0000 | ORAL_STRIP | Freq: Once | OPHTHALMIC | Status: AC
  Administered 2018-05-08: 1 via OPHTHALMIC
  Filled 2018-05-08: qty 1

## 2018-05-08 MED ORDER — KETOROLAC TROMETHAMINE 0.5 % OP SOLN: 1.0000 [drp] | Freq: Four times a day (QID) | OPHTHALMIC | 0 refills | Status: DC

## 2018-05-08 MED ORDER — POLYMYXIN B-TRIMETHOPRIM 10000-0.1 UNIT/ML-% OP SOLN: 2.0000 [drp] | Freq: Four times a day (QID) | OPHTHALMIC | 0 refills | Status: AC

## 2018-05-08 MED ORDER — KETOROLAC TROMETHAMINE 0.5 % OP SOLN: 1.0000 [drp] | Freq: Four times a day (QID) | OPHTHALMIC | 0 refills | Status: AC

## 2018-05-08 NOTE — ED Triage Notes (Signed)
Pt reports issues with his right eye for the last few days. Denies drainage but states it hurts.

## 2018-05-08 NOTE — ED Provider Notes (Signed)
[facility] Emergency Department Provider Note  ____________________________________________  Time seen: Approximately 4:26 PM  I have reviewed the triage vital signs and the nursing notes.   HISTORY  Chief Complaint Eye Problem    HPI Shaun Beasley is a 42 y.o. male who presents the emergency department complaining of right eye pain, redness.  Patient reports that he has a "bruised" feeling, with redness to the right eye.  Patient denies any known trauma to the eye.  He denies any purulent drainage from the right eye.  Patient reports that he has photophobia, blurred vision, "great out vision".  Patient has not tried medications for this complaint.  He does not wear glasses or contacts.  No other complaints at this time.  History reviewed. No pertinent past medical history.  There are no active problems to display for this patient.   Past Surgical History:  Procedure Laterality Date  . ABSCESS DRAINAGE      Prior to Admission medications   Medication Sig Start Date End Date Taking? Authorizing Provider  ibuprofen (ADVIL,MOTRIN) 600 MG tablet Take 1 tablet (600 mg total) by mouth every 6 (six) hours as needed. 06/09/16   Joni Reining, PA-C  ketorolac (ACULAR) 0.5 % ophthalmic solution Place 1 drop into the right eye 4 (four) times daily. 05/08/18   Currie Dennin, Delorise Royals, PA-C  trimethoprim-polymyxin b (POLYTRIM) ophthalmic solution Place 2 drops into the left eye every 6 (six) hours. 05/08/18   Meghen Akopyan, Delorise Royals, PA-C    Allergies Patient has no known allergies.  No family history on file.  Social History Social History   Tobacco Use  . Smoking status: Current Some Day Smoker    Packs/day: 0.50    Types: Cigarettes  . Smokeless tobacco: Never Used  Substance Use Topics  . Alcohol use: Yes    Alcohol/week: 15.0 oz    Types: 25 Cans of beer per week  . Drug use: Not on file     Review of Systems  Constitutional: No  fever/chills Eyes: Patient reports photophobia, decreased visual acuity, greyed out vision. No discharge ENT: No upper respiratory complaints. Cardiovascular: no chest pain. Respiratory: no cough. No SOB. Gastrointestinal: No abdominal pain.  No nausea, no vomiting.   Musculoskeletal: Negative for musculoskeletal pain. Skin: Negative for rash, abrasions, lacerations, ecchymosis. Neurological: Negative for headaches, focal weakness or numbness. 10-point ROS otherwise negative.  ____________________________________________   PHYSICAL EXAM:  VITAL SIGNS: ED Triage Vitals  Enc Vitals Group     BP 05/08/18 1516 127/82     Pulse Rate 05/08/18 1516 82     Resp 05/08/18 1516 16     Temp 05/08/18 1516 98.1 F (36.7 C)     Temp Source 05/08/18 1516 Oral     SpO2 05/08/18 1516 97 %     Weight 05/08/18 1516 180 lb (81.6 kg)     Height 05/08/18 1516  (1.854 m)     Head Circumference --      Peak Flow --      Pain Score 05/08/18 1514 8     Pain Loc --      Pain Edu? --      Excl. in GC? --      Constitutional: Alert and oriented. Well appearing and in no acute distress. Eyes: Conjunctiva on left is unremarkable.  Conjunctive on right with subconjunctival hemorrhaging to the medial, inferior, lateral aspect.  No subconjunctival hemorrhage in the superior aspect.  Funduscopic exam reveals red reflex  bilaterally.  Vasculature, optic disc is unremarkable right eye.  No visible abnormal findings on funduscopic exam of the right eye.  Peripheral vision of the right eye is consistent with peripheral vision of the left eye.  Patient is 20/15 bilateral visual acuity, 20/20 visual acuity left eye, 20/25 visual acuity right eye.  PERRL. EOMI. eyes anesthetized using tetracaine drops, fluorescein staining is applied with small areas of uptake to the lateral, inferior aspect of the eye.  3 areas of uptake are appreciated with no visible foreign body.  No dendritic ulcer is appreciated. Head:  Atraumatic. ENT:      Ears:       Nose: No congestion/rhinnorhea.      Mouth/Throat: Mucous membranes are moist.  Neck: No stridor.    Cardiovascular: Normal rate, regular rhythm. Normal S1 and S2.  Good peripheral circulation. Respiratory: Normal respiratory effort without tachypnea or retractions. Lungs CTAB. Good air entry to the bases with no decreased or absent breath sounds. Musculoskeletal: Full range of motion to all extremities. No gross deformities appreciated. Neurologic:  Normal speech and language. No gross focal neurologic deficits are appreciated.  Skin:  Skin is warm, dry and intact. No rash noted. Psychiatric: Mood and affect are normal. Speech and behavior are normal. Patient exhibits appropriate insight and judgement.   ____________________________________________   LABS (all labs ordered are listed, but only abnormal results are displayed)  Labs Reviewed - No data to display ____________________________________________  EKG   ____________________________________________  RADIOLOGY   No results found.  ____________________________________________    PROCEDURES  Procedure(s) performed:    Procedures    Medications  fluorescein ophthalmic strip 1 strip (1 strip Right Eye Given by Other 05/08/18 1651)  tetracaine (PONTOCAINE) 0.5 % ophthalmic solution 2 drop (2 drops Right Eye Given by Other 05/08/18 1651)     ____________________________________________   INITIAL IMPRESSION / ASSESSMENT AND PLAN / ED COURSE  Pertinent labs & imaging results that were available during my care of the patient were reviewed by me and considered in my medical decision making (see chart for details).  Review of the [place] CSRS was performed in accordance of the [facility] prior to dispensing any controlled drugs.     Patient's diagnosis is consistent with corneal abrasion and subconjunctival hemorrhage of the right eye.  Patient presents with pain, photophobia, visual  changes.  Exam reveals 3 small corneal abrasions to the right eye with subconjunctival hemorrhaging.  Eye exam is otherwise reassuring with no acute findings.  No further evaluation deemed necessary at this time.. Patient will be discharged home with prescriptions for Acular and Polytrim for symptom improvement. Patient is to follow up with primary care or ophthalmology as needed or otherwise directed. Patient is given ED precautions to return to the ED for any worsening or new symptoms.     ____________________________________________  FINAL CLINICAL IMPRESSION(S) / ED DIAGNOSES  Final diagnoses:  Abrasion of right cornea, initial encounter  Subconjunctival hemorrhage of right eye      NEW MEDICATIONS STARTED DURING THIS VISIT:  ED Discharge Orders        Ordered    ketorolac (ACULAR) 0.5 % ophthalmic solution  4 times daily     05/08/18 1648    trimethoprim-polymyxin b (POLYTRIM) ophthalmic solution  Every 6 hours     05/08/18 1648          This chart was dictated using voice recognition software/Dragon. Despite best efforts to proofread, errors can occur which can change the meaning. Any  change was purely unintentional.    Racheal Patches, PA-C 05/08/18 1653    Pershing Proud Myra Rude, MD 05/08/18 [phone]

## 2023-01-29 DIAGNOSIS — Z419 Encounter for procedure for purposes other than remedying health state, unspecified: Secondary | ICD-10-CM | POA: Diagnosis not present

## 2023-02-27 DIAGNOSIS — Z419 Encounter for procedure for purposes other than remedying health state, unspecified: Secondary | ICD-10-CM | POA: Diagnosis not present

## 2023-03-09 ENCOUNTER — Ambulatory Visit: Payer: Medicaid Other

## 2023-03-12 ENCOUNTER — Encounter: Payer: Self-pay | Admitting: Family Medicine

## 2023-03-12 ENCOUNTER — Ambulatory Visit: Payer: Medicaid Other | Admitting: Family Medicine

## 2023-03-12 DIAGNOSIS — B379 Candidiasis, unspecified: Secondary | ICD-10-CM | POA: Diagnosis not present

## 2023-03-12 DIAGNOSIS — Z113 Encounter for screening for infections with a predominantly sexual mode of transmission: Secondary | ICD-10-CM | POA: Diagnosis not present

## 2023-03-12 LAB — HM HIV SCREENING LAB: HM HIV Screening: NEGATIVE

## 2023-03-12 LAB — HEPATITIS B SURFACE ANTIGEN: Hepatitis B Surface Ag: NONREACTIVE

## 2023-03-12 LAB — HM HEPATITIS C SCREENING LAB: HM Hepatitis Screen: NEGATIVE

## 2023-03-12 MED ORDER — CLOTRIMAZOLE-BETAMETHASONE 1-0.05 % EX CREA: 1.0000 | TOPICAL_CREAM | Freq: Two times a day (BID) | CUTANEOUS | 0 refills | Status: AC

## 2023-03-12 NOTE — Progress Notes (Signed)
Pt is here for STD screening.  Condoms given.  Madelin Weseman M Ervin Rothbauer, RN  

## 2023-03-12 NOTE — Progress Notes (Signed)
[facility] Department STI clinic/screening visit  Subjective:  Shaun Beasley is a 47 y.o. male being seen today for an STI screening visit. The patient reports they do have symptoms.    Patient has the following medical conditions:  There are no problems to display for this patient.   Chief Complaint  Patient presents with   SEXUALLY TRANSMITTED DISEASE    Screening- patient complaining of rash on penis     HPI  Patient reports to clinic for STI testing. Pt states he has a rash on his penis Last HIV test per patient/review of record was No results found for: "HMHIVSCREEN" No results found for: "HIV"  Does the patient or their partner desires a pregnancy in the next year? No Screening for MPX risk: Does the patient have an unexplained rash? No Is the patient MSM? No Does the patient endorse multiple sex partners or anonymous sex partners? No Did the patient have close or sexual contact with a person diagnosed with MPX? No Has the patient traveled outside the [place] where MPX is endemic? No Is there a high clinical suspicion for MPX-- evidenced by one of the following No  -Unlikely to be chickenpox  -Lymphadenopathy  -Rash that present in same phase of evolution on any given body part   See flowsheet for further details and programmatic requirements.   There is no immunization history on file for this patient.   The following portions of the patient's history were reviewed and updated as appropriate: allergies, current medications, past medical history, past social history, past surgical history and problem list.  Objective:  There were no vitals filed for this visit.  Physical Exam Constitutional:      Appearance: Normal appearance.  HENT:     Head: Normocephalic and atraumatic.     Comments: No nits or hair loss    Mouth/Throat:     Mouth: Mucous membranes are moist. No oral lesions.     Pharynx: Oropharynx is clear. No oropharyngeal exudate or posterior  oropharyngeal erythema.  Eyes:     General:        Right eye: No discharge.        Left eye: No discharge.     Conjunctiva/sclera:     Right eye: Right conjunctiva is not injected. No exudate.    Left eye: Left conjunctiva is not injected. No exudate. Pulmonary:     Effort: Pulmonary effort is normal.  Abdominal:     General: Abdomen is flat.     Palpations: Abdomen is soft. There is no hepatomegaly or mass.     Tenderness: There is no abdominal tenderness. There is no rebound.     Hernia: There is no hernia in the left inguinal area or right inguinal area.  Genitourinary:    Pubic Area: No rash or pubic lice (no nits).      Penis: Circumcised. Erythema and lesions present. No tenderness, discharge or swelling.      Testes: Normal.     Epididymis:     Right: Normal. No mass or tenderness.     Left: Normal. No mass or tenderness.     Rectum: Normal. No tenderness (no lesions or discharge).       Comments: Penile Discharge Amount: none Color:  none  Lesion by head of penis   Cracked redness near base of penis Lymphadenopathy:     Head:     Right side of head: No preauricular or posterior auricular adenopathy.  Left side of head: No preauricular or posterior auricular adenopathy.     Cervical: No cervical adenopathy.     Upper Body:     Right upper body: No supraclavicular, axillary or epitrochlear adenopathy.     Left upper body: No supraclavicular, axillary or epitrochlear adenopathy.     Lower Body: No right inguinal adenopathy. No left inguinal adenopathy.  Skin:    General: Skin is warm and dry.     Findings: No lesion or rash.  Neurological:     Mental Status: He is alert and oriented to person, place, and time.     Assessment and Plan:  Shaun Beasley is a 47 y.o. male presenting to the [facility] for STI screening  1. Screening for venereal disease  - Chlamydia/GC NAA, Confirmation - HIV [place] [facility] - Syphilis Serology, [place]  [facility] - Gonococcus culture - Virology, [place] [facility] - HIV/HCV [place] [facility] - HBV Antigen/Antibody [facility]  2. Candida infection  - clotrimazole-betamethasone (LOTRISONE) cream; Apply 1 Application topically 2 (two) times daily.  Dispense: 28 g; Refill: 0  Patient does have STI symptoms Patient accepted all screenings including  urine GC/Chlamydia, and blood work for HIV/Syphilis. Patient meets criteria for HepB screening? Yes. Ordered? yes Patient meets criteria for HepC screening? Yes. Ordered? yes Recommended condom use with all sex Discussed importance of condom use for STI prevent  Treat positive test results per standing order. Discussed time line for [facility] results and that patient will be called with positive results and encouraged patient to call if he had not heard in 2 weeks Recommended repeat testing in 3 months with positive results. Recommended returning for continued or worsening symptoms.   Return if symptoms worsen or fail to improve.  Future Appointments  Date Time Provider [place]  03/12/2023  2:50 PM Sharlet Salina, FNP [facility] None   Total time spent 30 minutes Sharlet Salina, [place]

## 2023-03-16 LAB — CHLAMYDIA/GC NAA, CONFIRMATION
Chlamydia trachomatis, NAA: NEGATIVE
Neisseria gonorrhoeae, NAA: NEGATIVE

## 2023-03-16 LAB — GONOCOCCUS CULTURE

## 2023-03-24 NOTE — Addendum Note (Signed)
Addended by: Windle Guard on: 03/24/2023 10:49 AM   Modules accepted: Orders

## 2023-03-30 DIAGNOSIS — Z419 Encounter for procedure for purposes other than remedying health state, unspecified: Secondary | ICD-10-CM | POA: Diagnosis not present

## 2023-04-13 NOTE — Addendum Note (Signed)
Addended by: Rashidi Loh on: 04/13/2023 01:28 PM   Modules accepted: Orders  

## 2023-04-29 DIAGNOSIS — Z419 Encounter for procedure for purposes other than remedying health state, unspecified: Secondary | ICD-10-CM | POA: Diagnosis not present

## 2023-05-08 DIAGNOSIS — S90821A Blister (nonthermal), right foot, initial encounter: Secondary | ICD-10-CM | POA: Diagnosis not present

## 2023-05-08 DIAGNOSIS — S90822A Blister (nonthermal), left foot, initial encounter: Secondary | ICD-10-CM | POA: Diagnosis not present

## 2023-05-21 DIAGNOSIS — S90822A Blister (nonthermal), left foot, initial encounter: Secondary | ICD-10-CM | POA: Diagnosis not present

## 2023-05-24 DIAGNOSIS — B353 Tinea pedis: Secondary | ICD-10-CM | POA: Diagnosis not present

## 2023-05-24 DIAGNOSIS — E861 Hypovolemia: Secondary | ICD-10-CM | POA: Diagnosis not present

## 2023-05-24 DIAGNOSIS — Z79899 Other long term (current) drug therapy: Secondary | ICD-10-CM | POA: Diagnosis not present

## 2023-05-30 DIAGNOSIS — Z419 Encounter for procedure for purposes other than remedying health state, unspecified: Secondary | ICD-10-CM | POA: Diagnosis not present

## 2023-06-29 DIAGNOSIS — Z419 Encounter for procedure for purposes other than remedying health state, unspecified: Secondary | ICD-10-CM | POA: Diagnosis not present

## 2023-07-30 DIAGNOSIS — Z419 Encounter for procedure for purposes other than remedying health state, unspecified: Secondary | ICD-10-CM | POA: Diagnosis not present

## 2023-08-30 DIAGNOSIS — Z419 Encounter for procedure for purposes other than remedying health state, unspecified: Secondary | ICD-10-CM | POA: Diagnosis not present

## 2023-09-29 DIAGNOSIS — Z419 Encounter for procedure for purposes other than remedying health state, unspecified: Secondary | ICD-10-CM | POA: Diagnosis not present
